# Patient Record
Sex: Female | Born: 1970 | Race: White | Hispanic: No | Marital: Married | Smoking: Never smoker
Health system: Southern US, Community
[De-identification: ages and names within clinical notes are randomized; demographics above are authoritative.]

---

## 1997-09-14 ENCOUNTER — Other Ambulatory Visit: Admission: RE | Admit: 1997-09-14 | Discharge: 1997-09-14 | Payer: Self-pay | Admitting: Obstetrics & Gynecology

## 1998-04-17 ENCOUNTER — Other Ambulatory Visit: Admission: RE | Admit: 1998-04-17 | Discharge: 1998-04-17 | Payer: Self-pay | Admitting: Obstetrics & Gynecology

## 1999-05-30 ENCOUNTER — Other Ambulatory Visit: Admission: RE | Admit: 1999-05-30 | Discharge: 1999-05-30 | Payer: Self-pay | Admitting: Obstetrics & Gynecology

## 2000-04-15 ENCOUNTER — Other Ambulatory Visit: Admission: RE | Admit: 2000-04-15 | Discharge: 2000-04-15 | Payer: Self-pay | Admitting: Obstetrics & Gynecology

## 2000-10-25 ENCOUNTER — Inpatient Hospital Stay (HOSPITAL_COMMUNITY): Admission: AD | Admit: 2000-10-25 | Discharge: 2000-10-25 | Payer: Self-pay | Admitting: Obstetrics & Gynecology

## 2000-10-28 ENCOUNTER — Ambulatory Visit (HOSPITAL_COMMUNITY): Admission: RE | Admit: 2000-10-28 | Discharge: 2000-10-28 | Payer: Self-pay | Admitting: Obstetrics & Gynecology

## 2000-10-29 ENCOUNTER — Inpatient Hospital Stay (HOSPITAL_COMMUNITY): Admission: AD | Admit: 2000-10-29 | Discharge: 2000-11-01 | Payer: Self-pay | Admitting: Obstetrics & Gynecology

## 2000-11-06 ENCOUNTER — Encounter: Admission: RE | Admit: 2000-11-06 | Discharge: 2000-12-06 | Payer: Self-pay | Admitting: Obstetrics & Gynecology

## 2000-12-22 ENCOUNTER — Other Ambulatory Visit: Admission: RE | Admit: 2000-12-22 | Discharge: 2000-12-22 | Payer: Self-pay | Admitting: Obstetrics & Gynecology

## 2001-01-06 ENCOUNTER — Encounter: Admission: RE | Admit: 2001-01-06 | Discharge: 2001-02-05 | Payer: Self-pay | Admitting: Obstetrics & Gynecology

## 2001-11-22 ENCOUNTER — Other Ambulatory Visit: Admission: RE | Admit: 2001-11-22 | Discharge: 2001-11-22 | Payer: Self-pay | Admitting: Obstetrics & Gynecology

## 2001-11-25 ENCOUNTER — Encounter: Payer: Self-pay | Admitting: Obstetrics & Gynecology

## 2001-11-25 ENCOUNTER — Encounter: Admission: RE | Admit: 2001-11-25 | Discharge: 2001-11-25 | Payer: Self-pay | Admitting: Obstetrics & Gynecology

## 2002-05-23 ENCOUNTER — Inpatient Hospital Stay (HOSPITAL_COMMUNITY): Admission: AD | Admit: 2002-05-23 | Discharge: 2002-05-27 | Payer: Self-pay | Admitting: Obstetrics & Gynecology

## 2002-05-23 ENCOUNTER — Encounter (INDEPENDENT_AMBULATORY_CARE_PROVIDER_SITE_OTHER): Payer: Self-pay | Admitting: *Deleted

## 2002-06-28 ENCOUNTER — Other Ambulatory Visit: Admission: RE | Admit: 2002-06-28 | Discharge: 2002-06-28 | Payer: Self-pay | Admitting: Obstetrics & Gynecology

## 2003-07-02 ENCOUNTER — Emergency Department (HOSPITAL_COMMUNITY): Admission: AD | Admit: 2003-07-02 | Discharge: 2003-07-02 | Payer: Self-pay | Admitting: Family Medicine

## 2003-07-18 ENCOUNTER — Other Ambulatory Visit: Admission: RE | Admit: 2003-07-18 | Discharge: 2003-07-18 | Payer: Self-pay | Admitting: Obstetrics & Gynecology

## 2003-10-08 ENCOUNTER — Emergency Department (HOSPITAL_COMMUNITY): Admission: EM | Admit: 2003-10-08 | Discharge: 2003-10-08 | Payer: Self-pay | Admitting: Family Medicine

## 2003-12-27 ENCOUNTER — Emergency Department (HOSPITAL_COMMUNITY): Admission: EM | Admit: 2003-12-27 | Discharge: 2003-12-27 | Payer: Self-pay | Admitting: Emergency Medicine

## 2004-08-19 ENCOUNTER — Other Ambulatory Visit: Admission: RE | Admit: 2004-08-19 | Discharge: 2004-08-19 | Payer: Self-pay | Admitting: Obstetrics and Gynecology

## 2005-09-10 ENCOUNTER — Encounter (INDEPENDENT_AMBULATORY_CARE_PROVIDER_SITE_OTHER): Payer: Self-pay | Admitting: Specialist

## 2005-09-10 ENCOUNTER — Encounter: Admission: RE | Admit: 2005-09-10 | Discharge: 2005-09-10 | Payer: Self-pay | Admitting: Obstetrics & Gynecology

## 2005-10-17 ENCOUNTER — Encounter: Admission: RE | Admit: 2005-10-17 | Discharge: 2005-10-17 | Payer: Self-pay | Admitting: Obstetrics & Gynecology

## 2008-02-03 ENCOUNTER — Encounter: Admission: RE | Admit: 2008-02-03 | Discharge: 2008-02-03 | Payer: Self-pay | Admitting: Obstetrics & Gynecology

## 2008-02-11 ENCOUNTER — Emergency Department (HOSPITAL_COMMUNITY): Admission: EM | Admit: 2008-02-11 | Discharge: 2008-02-11 | Payer: Self-pay | Admitting: Family Medicine

## 2010-06-10 ENCOUNTER — Other Ambulatory Visit: Payer: Self-pay | Admitting: Dermatology

## 2010-10-04 NOTE — Op Note (Signed)
NAME:  [name], [name]                            ACCOUNT NO.:  [id number]   MEDICAL RECORD NO.:  [id number]                   PATIENT TYPE:  INP   LOCATION:  9144                                 FACILITY:  [facility]   PHYSICIAN:  Freddy Finner, M.D.                DATE OF BIRTH:  Jun 19, 1970   DATE OF PROCEDURE:  DATE OF DISCHARGE:                                 OPERATIVE REPORT   PREOPERATIVE DIAGNOSES:  1. Intrauterine pregnancy at 37 weeks' gestation.  2. Surgically scarred uterus.  3. Multiparity.  4. In labor.   POSTOPERATIVE DIAGNOSES:  1. Intrauterine pregnancy at 37 weeks' gestation.  2. Surgically scarred uterus.  3. Multiparity.  4. In labor.   PROCEDURE:  Delivery of viable female infant, Apgars of 9 at one minute, 9 at  five minutes, cord arterial pH of 7.34.   SECONDARY PROCEDURE:  Bilateral tubal ligation by [place] technique.   SURGEON:  Freddy Finner, M.D.   ASSISTANT:  Dineen Kid. Rana Snare, M.D.   ANESTHESIA:  Spinal.   ESTIMATED BLOOD LOSS:  600 cubic centimeters.   COMPLICATIONS:  None.  Cord blood was collected for stem cell storage  intraoperatively with Betadine prep of the cord and sterile collection of  the specimen, and provided anticoagulant and __________.   INDICATIONS FOR PROCEDURE:  The patient was admitted on the evening of  surgery in labor at 37 weeks' gestation.  She had a scheduled C-section in  approximately 10 days, but presented in labor.  This is her third viable  pregnancy and she has requested voluntary sterilization.   DESCRIPTION OF PROCEDURE:  She was brought to the operating room and was  placed under adequate spinal anesthesia, placed in the dorsal recumbent  position with elevation of the right hip by 15 degrees.  The abdomen was  prepped and draped with sterile scrub followed by sterile Betadine solution.  Foley catheter was placed using sterile technique.  Sterile drapes were  applied.  A lower abdominal transverse incision was  made and an old scar  excised sharply.  Incision was carried down the fascia which was trimmed  __________.  The skin was stationed.  Rectus sheath was developed superiorly  and inferiorly with blunt and sharp dissection.  Rectus muscles were divided  in the midline.  The peritoneum was entered sharply and extended bluntly to  the external skin incision.  Bladder blade was placed.  A transverse  incision was made in the initial peritoneum overlying the lower segment and  the bladder bluntly dissected off the lower segment.  The lower segment was  entered sharply in a transverse direction and extended transversely with  blunt dissection.  Using a QE  vacuum extractor over fundal compression, a  viable female infant was then delivered without difficulty.  A single-edged  nuchal cord was reduced.  Apgars and cord pH were noted above.  Arterial  cord sample was obtained for gases.  After Betadine prep of the umbilical  cord the stem cell specimen was collected and passed off the table.  Uterus  was then delivered free of the abdominal incision.  Tubes and ovaries were  normal.  The uterine incision was closed in a single layer of running 0  Monocryl.  Bladder flap was reapproximated with an interrupted 0 Monocryl.  Window was created in the mesosalpinx while elevating each tube with Babcock  and through it, #0 plain ties were placed inside at approximately 2 cm  intervals and a segment of tube excised.  The distal mucosal portion of the  distal tube was fulgurated with a Bovie, 0 silk suture was passed through  the proximal mesosalpinx and tied above the __________.  Hemostasis was  adequate.  The uterus was delivered back into the abdominal cavity.  Irrigation was carried out.  Again hemostasis was complete.  All packs,  needles, and instruments were removed.  Counts were correct.  Abdominal  incision was closed in layers.  Running 0 Monocryl was used to close the  peritoneum and reapproximate  the rectus muscles.  __________ was closed with  running 0 PDS.  Subcutaneous tissue was approximated with running 3-0 plain.  Skin was closed with rod and skin staples and quarter inch Steri-Strips.  The patient tolerated the procedure well and was taken to recovery in good  condition.                                                    Freddy Finner, M.D.    WRN/MEDQ  D:  05/23/2002  T:  05/23/2002  Job:  [id number]

## 2010-10-04 NOTE — Discharge Summary (Signed)
NAMEJezlyn, [name]                            ACCOUNT NO.:  [id number]   MEDICAL RECORD NO.:  [id number]                   PATIENT TYPE:  INP   LOCATION:  9144                                 FACILITY:  [facility]   PHYSICIAN:  Freddy Finner, M.D.                DATE OF BIRTH:  1970-08-06   DATE OF ADMISSION:  05/23/2002  DATE OF DISCHARGE:  05/27/2002                                 DISCHARGE SUMMARY   ADMISSION DIAGNOSES:  1. Intrauterine pregnancy at 37 weeks estimated gestational age.  2. Surgically scarred uterus.  3. Multiparity, desires sterility for labor.   DISCHARGE DIAGNOSES:  1. Status post low transverse cesarean section.  2. Viable female infant.  3. Permanent sterilization.   PROCEDURE:  1. Repeat low transverse cesarean section.  2. Bilateral tubal ligation.   REASON FOR ADMISSION:  Please see written H&P.   HOSPITAL COURSE:  The patient is a 40 year old gravida 3, para 2 who  presented to [facility] in labor at 37 weeks estimated  gestational age.  The patient had a history of a previous cesarean delivery  and was scheduled for a repeat cesarean delivery in approximately 10 days.  The patient had also requested permanent sterilization.  The patient was  prepped accordingly and taken to the operating room where spinal anesthesia  was administered without difficulty.  A low transverse incision was made  with the delivery of a viable female infant weighing 6 pounds 13 ounces with  Apgars of 9 at one minute and 9 at five minutes.  Umbilical cord pH was  7.34.  Cord blood was obtained for stem cell storage.  Bilateral tubal  ligation was performed.  The patient tolerated the procedure well and was  taken to the recovery room in stable condition.   On postoperative day #1, the patient had good return of bowel function.  Abdomen was soft.  Abdominal dressing had some small old drainage noted on  bandage.  The patient was ambulating without assistance.   Labs revealed a  hemoglobin 10.2, platelet count 159,000, WBC count of 8.1.  On postoperative  day #2, the patient was doing well.  Abdominal dressing was removed,  revealing an incision that was clean, dry, and intact.  On postoperative day  #3, the patient complained of increase in abdominal distension.  The abdomen  was soft.  The incision was noted to have slight ecchymosis noted on the  left side of the incision and a small amount of oozing that was noted at the  midpoint.  Labs revealed a hemoglobin of 10.2, WBC count 8.1.  A Dulcolax  suppository was administered.  On postoperative day #4, the patient was  doing well.  Abdomen was soft.  The incision was clean, dry, and intact.  No  further oozing was noted.  Ecchymosis continued on the left margin of the  incision.  The  staples were removed.  The patient was discharged to home.   CONDITION ON DISCHARGE:  Good.   DIET:  Regular as tolerated.   ACTIVITY:  No heavy lifting, no driving x2 weeks, no vaginal entry.   FOLLOW UP:  The patient is to follow up in the office in one to two weeks  for an incision check.  She should call for a temperature greater than 100  degrees, persistent nausea and vomiting, heavy vaginal bleeding, and/or  redness or drainage from the incisional site.   DISCHARGE MEDICATIONS:  1. Percocet 5/325, #30, one p.o. every four to six hours p.r.n. pain.  2. Ambien 10 mg, #21, p.o. q.h.s. p.r.n. insomnia.  3. Motrin 600 mg, #30, one p.o. every six hours.  4. Prenatal vitamin one p.o. daily.     Julio Sicks, N.P.                        Freddy Finner, M.D.    CC/MEDQ  D:  06/30/2002  T:  06/30/2002  Job:  [id number]

## 2010-10-04 NOTE — H&P (Signed)
   NAME:  [name]                            ACCOUNT NO.:  [id number]   MEDICAL RECORD NO.:  [id number]                   PATIENT TYPE:  INP   LOCATION:  9198                                 FACILITY:  [facility]   PHYSICIAN:  Dineen Kid. Rana Snare, M.D.                 DATE OF BIRTH:  [birth year]   DATE OF ADMISSION:  05/23/2002  DATE OF DISCHARGE:                                HISTORY & PHYSICAL   HISTORY OF PRESENT ILLNESS:  The patient is a 40 year old G3, P2 at 55 and 1  estimated gestational age who presents in early labor, contracting every  five minutes.  Her estimated date of confinement is June 12, 2002.  Pregnancy has been uncomplicated.  She does have a history of a previous  cesarean section and a previous child in 1994 that was severely ill with  group [name] Strep.  Because of history of group [name] Strep in the first child, they  had a scheduled cesarean section with their second child in June 2002 and  presents for a repeat cesarean section today.  History of GBS is positive.  The patient also adamantly desires sterilization.   PAST MEDICAL HISTORY:  Significant for OCD.   PAST SURGICAL HISTORY:  Significant for cesarean section in June 2002 and  also rhinoplasty at age 30.   MEDICATIONS:  Paxil and prenatal vitamins.   ALLERGIES:  She is allergic to DEMEROL.   PHYSICAL EXAMINATION:  VITAL SIGNS:  Blood pressure 104/62.  HEART:  Regular rate and rhythm.  LUNGS:  Clear to auscultation bilaterally.  ABDOMEN:  Gravid, nontender.  PELVIC:  Cervix is closed, thick, and high.  Fetal heart rate in the 140s.   IMPRESSION AND PLAN:  1. Intrauterine pregnancy at 28 and 1/7 weeks in early labor.  2. Previous cesarean section, desired repeat.  3. Also, adamantly desires sterilization.  Risks and benefits of the     procedure were discussed at length which include, but not limited to,     risk of infection, bleeding, damage to uterus, tubes, ovaries, bowel,     bladder, and fetus.  Also,  risk of tubal failure quoted as 5 out of 1000.     The patient gives her informed consent.                                               Dineen Kid Rana Snare, M.D.    DCL/MEDQ  D:  05/23/2002  T:  05/23/2002  Job:  [id number]

## 2010-10-04 NOTE — Discharge Summary (Signed)
[facility]  Patient:    [name], [name]                         MRN: [id number] Adm. Date:  [id number] Disc. Date: [id number] Attending:  Minette Headland Dictator:   Danie Chandler, R.N.                           Discharge Summary  ADMITTING DIAGNOSES:          1. Intrauterine pregnancy at term.                               2. Mature LS ratio and positive prostaglandin                                  on amniocentesis.                               3. Previous history of Streptococcus sepsis with                                  the patients first delivery requiring                                  ______ and patient request to avoid risk of                                  Streptococcus sepsis by having cesarean.  DISCHARGE DIAGNOSES:          1. Intrauterine pregnancy at term.                               2. Mature LS ratio and positive prostaglandin                                  on amniocentesis.                               3. Previous history of Streptococcus sepsis with                                  the patients first delivery requiring                                  ______ and patient request to avoid risk of                                  Streptococcus sepsis by having cesarean.  PROCEDURE:                    On October 29, 2000 a primary low transverse  cesarean section.  REASON FOR ADMISSION:         Please see dictated H&P.  HOSPITAL COURSE:              The patient was taken to the operating room and underwent the above named procedure without complication.  This was productive of a viable female infant with Apgars of 8 at one minute and 9 at five minutes.  Postoperatively on day #1 the patients hemoglobin was 10.3, hematocrit 30.6, white blood cell count 7.4.  The patient had a good return of bowel function on this day and was without complaint.  On postoperative day #2 she was tolerating a regular diet and ambulating well  without difficulty.  She also had good pain control.  She was discharged home on postoperative day #3.  CONDITION ON DISCHARGE:       Good.  DIET:                         Regular, as tolerated.  ACTIVITY:                     No heavy lifting, no driving, no vaginal entry.  FOLLOW-UP:                    In the office in one to two weeks for incision check.  She is to call for temperature greater than 100 degrees, persistent nausea or vomiting, heavy vaginal bleeding, and/or redness or drainage from the incision site.  DISCHARGE MEDICATIONS:        1. Prenatal vitamins one p.o. q.d.                               2. Tylox one q.4h. p.r.n. pain. DD:  11/18/00 TD:  11/18/00 Job: [id number]

## 2010-10-04 NOTE — H&P (Signed)
[facility]  PatientARLENY, [name]                           MRN: [id number] Adm. Date:  10/29/00 Attending:  Freddy Finner, M.D.                         History and Physical  ADMISSION DIAGNOSES:          1. Intrauterine pregnancy at 38-1/[redacted] weeks                                  gestation.                               2. Mature pattern for lung maturity on                                  amniocentesis on October 28, 2000.                               3. History of major neonatal complication on                                  first delivery with streptococcal sepsis.                               4. Request on patients behalf to avoid that                                  risk by cesarean delivery on this infant.  HISTORY OF PRESENT ILLNESS:   The patient is a 40 year old black married female, gravida 2, para 1, whose current pregnancy has been uneventful.  She has had some latent phase activity suggesting impending onset of labor.  She is 1 cm dilated, 25% effaced on examination of the cervix in the office on the day prior to admission.  Her first delivery was complicated by strep sepsis in her infant who was transported to [facility] and cared for there with ECMO.  Based on this complication with the first delivery, she is unwilling to take that risk with this pregnancy and is now admitted with membranes intact for cesarean delivery.  An amniocentesis performed in the office on June 12 revealed L/S ration of of 3.5:1 with PG present.  REVIEW OF SYSTEMS:            Currently negative except for for sporadic contractions, one episode requiring evaluation in the hospital approximately two to three days prior to this admission.  PAST MEDICAL HISTORY:         Recorded in detail in the prenatal summary and will not be repeated.  FAMILY HISTORY:               Noncontributory.  PHYSICAL EXAMINATION:  HEENT:                        Grossly  normal.  VITAL  SIGNS:                  Blood pressure in the office was 110/70.  Weight 158.  CHEST:                        Clear to auscultation.  HEART:                        Normal sinus rhythm without murmurs, rubs, or gallops.  ABDOMEN:                      Gravid. Estimated fetal weight 7-1/2 pounds.  PELVIC:                       As recorded, cervix is 25% effaced, 1 cm dilated.  EXTREMITIES:                  Show +1 edema with no cyanosis or clubbing.  ASSESSMENT:                   1. Intrauterine pregnancy at 38-1/[redacted] weeks                                  gestation.                               2. Sever complication with first delivery due                                  to streptococcal sepsis.                               3. Patient request for cesarean delivery with                                  this pregnancy.                               4. Mature licithin/sphingomyelin (L/S) ratio.                               5. Positive prostaglandin on amniocentesis.  PLAN:                         Cesarean delivery on October 29, 2000. DD:  10/28/00 TD:  10/28/00 Job: [id number]

## 2014-04-27 ENCOUNTER — Other Ambulatory Visit: Payer: Self-pay | Admitting: Obstetrics & Gynecology

## 2014-04-28 LAB — CYTOLOGY - PAP

## 2016-06-12 DIAGNOSIS — S022XXA Fracture of nasal bones, initial encounter for closed fracture: Secondary | ICD-10-CM | POA: Diagnosis not present

## 2016-06-12 DIAGNOSIS — S62356A Nondisplaced fracture of shaft of fifth metacarpal bone, right hand, initial encounter for closed fracture: Secondary | ICD-10-CM | POA: Diagnosis not present

## 2016-06-13 DIAGNOSIS — S62356A Nondisplaced fracture of shaft of fifth metacarpal bone, right hand, initial encounter for closed fracture: Secondary | ICD-10-CM | POA: Diagnosis not present

## 2016-07-09 DIAGNOSIS — Z1389 Encounter for screening for other disorder: Secondary | ICD-10-CM | POA: Diagnosis not present

## 2016-09-15 DIAGNOSIS — Z1231 Encounter for screening mammogram for malignant neoplasm of breast: Secondary | ICD-10-CM | POA: Diagnosis not present

## 2016-09-15 DIAGNOSIS — Z01419 Encounter for gynecological examination (general) (routine) without abnormal findings: Secondary | ICD-10-CM | POA: Diagnosis not present

## 2016-09-26 DIAGNOSIS — D2362 Other benign neoplasm of skin of left upper limb, including shoulder: Secondary | ICD-10-CM | POA: Diagnosis not present

## 2016-09-26 DIAGNOSIS — D2262 Melanocytic nevi of left upper limb, including shoulder: Secondary | ICD-10-CM | POA: Diagnosis not present

## 2016-09-26 DIAGNOSIS — D2261 Melanocytic nevi of right upper limb, including shoulder: Secondary | ICD-10-CM | POA: Diagnosis not present

## 2017-02-17 DIAGNOSIS — Z0000 Encounter for general adult medical examination without abnormal findings: Secondary | ICD-10-CM | POA: Diagnosis not present

## 2017-02-23 DIAGNOSIS — Z1389 Encounter for screening for other disorder: Secondary | ICD-10-CM | POA: Diagnosis not present

## 2017-02-23 DIAGNOSIS — Z0000 Encounter for general adult medical examination without abnormal findings: Secondary | ICD-10-CM | POA: Diagnosis not present

## 2017-02-23 DIAGNOSIS — Z23 Encounter for immunization: Secondary | ICD-10-CM | POA: Diagnosis not present

## 2017-02-23 DIAGNOSIS — D72819 Decreased white blood cell count, unspecified: Secondary | ICD-10-CM | POA: Diagnosis not present

## 2017-08-08 DIAGNOSIS — R51 Headache: Secondary | ICD-10-CM | POA: Diagnosis not present

## 2017-08-08 DIAGNOSIS — R05 Cough: Secondary | ICD-10-CM | POA: Diagnosis not present

## 2017-08-24 DIAGNOSIS — M76899 Other specified enthesopathies of unspecified lower limb, excluding foot: Secondary | ICD-10-CM | POA: Diagnosis not present

## 2017-09-02 DIAGNOSIS — S76311D Strain of muscle, fascia and tendon of the posterior muscle group at thigh level, right thigh, subsequent encounter: Secondary | ICD-10-CM | POA: Diagnosis not present

## 2017-09-08 DIAGNOSIS — S76311D Strain of muscle, fascia and tendon of the posterior muscle group at thigh level, right thigh, subsequent encounter: Secondary | ICD-10-CM | POA: Diagnosis not present

## 2017-09-10 DIAGNOSIS — S76311D Strain of muscle, fascia and tendon of the posterior muscle group at thigh level, right thigh, subsequent encounter: Secondary | ICD-10-CM | POA: Diagnosis not present

## 2017-09-14 DIAGNOSIS — S76311D Strain of muscle, fascia and tendon of the posterior muscle group at thigh level, right thigh, subsequent encounter: Secondary | ICD-10-CM | POA: Diagnosis not present

## 2017-09-16 DIAGNOSIS — S76311D Strain of muscle, fascia and tendon of the posterior muscle group at thigh level, right thigh, subsequent encounter: Secondary | ICD-10-CM | POA: Diagnosis not present

## 2017-09-22 DIAGNOSIS — S76311D Strain of muscle, fascia and tendon of the posterior muscle group at thigh level, right thigh, subsequent encounter: Secondary | ICD-10-CM | POA: Diagnosis not present

## 2017-09-24 DIAGNOSIS — S76311D Strain of muscle, fascia and tendon of the posterior muscle group at thigh level, right thigh, subsequent encounter: Secondary | ICD-10-CM | POA: Diagnosis not present

## 2017-09-29 DIAGNOSIS — S76311D Strain of muscle, fascia and tendon of the posterior muscle group at thigh level, right thigh, subsequent encounter: Secondary | ICD-10-CM | POA: Diagnosis not present

## 2017-10-01 DIAGNOSIS — S76311D Strain of muscle, fascia and tendon of the posterior muscle group at thigh level, right thigh, subsequent encounter: Secondary | ICD-10-CM | POA: Diagnosis not present

## 2017-10-06 DIAGNOSIS — S76311D Strain of muscle, fascia and tendon of the posterior muscle group at thigh level, right thigh, subsequent encounter: Secondary | ICD-10-CM | POA: Diagnosis not present

## 2017-10-13 DIAGNOSIS — S76311D Strain of muscle, fascia and tendon of the posterior muscle group at thigh level, right thigh, subsequent encounter: Secondary | ICD-10-CM | POA: Diagnosis not present

## 2017-10-16 DIAGNOSIS — S76311D Strain of muscle, fascia and tendon of the posterior muscle group at thigh level, right thigh, subsequent encounter: Secondary | ICD-10-CM | POA: Diagnosis not present

## 2017-10-20 DIAGNOSIS — S76311D Strain of muscle, fascia and tendon of the posterior muscle group at thigh level, right thigh, subsequent encounter: Secondary | ICD-10-CM | POA: Diagnosis not present

## 2017-10-21 DIAGNOSIS — D229 Melanocytic nevi, unspecified: Secondary | ICD-10-CM | POA: Diagnosis not present

## 2017-10-21 DIAGNOSIS — L570 Actinic keratosis: Secondary | ICD-10-CM | POA: Diagnosis not present

## 2017-10-28 DIAGNOSIS — M5416 Radiculopathy, lumbar region: Secondary | ICD-10-CM | POA: Diagnosis not present

## 2017-10-29 DIAGNOSIS — M5416 Radiculopathy, lumbar region: Secondary | ICD-10-CM | POA: Diagnosis not present

## 2017-11-05 DIAGNOSIS — Z01419 Encounter for gynecological examination (general) (routine) without abnormal findings: Secondary | ICD-10-CM | POA: Diagnosis not present

## 2017-11-05 DIAGNOSIS — Z681 Body mass index (BMI) 19 or less, adult: Secondary | ICD-10-CM | POA: Diagnosis not present

## 2017-12-03 DIAGNOSIS — M5416 Radiculopathy, lumbar region: Secondary | ICD-10-CM | POA: Diagnosis not present

## 2018-01-04 DIAGNOSIS — M5416 Radiculopathy, lumbar region: Secondary | ICD-10-CM | POA: Diagnosis not present

## 2018-01-04 DIAGNOSIS — M5431 Sciatica, right side: Secondary | ICD-10-CM | POA: Diagnosis not present

## 2018-01-28 DIAGNOSIS — M5431 Sciatica, right side: Secondary | ICD-10-CM | POA: Diagnosis not present

## 2018-02-01 DIAGNOSIS — Z23 Encounter for immunization: Secondary | ICD-10-CM | POA: Diagnosis not present

## 2018-03-16 DIAGNOSIS — Z0000 Encounter for general adult medical examination without abnormal findings: Secondary | ICD-10-CM | POA: Diagnosis not present

## 2018-03-16 DIAGNOSIS — R82998 Other abnormal findings in urine: Secondary | ICD-10-CM | POA: Diagnosis not present

## 2018-03-23 DIAGNOSIS — Z1389 Encounter for screening for other disorder: Secondary | ICD-10-CM | POA: Diagnosis not present

## 2018-03-23 DIAGNOSIS — Z23 Encounter for immunization: Secondary | ICD-10-CM | POA: Diagnosis not present

## 2018-03-23 DIAGNOSIS — Z0000 Encounter for general adult medical examination without abnormal findings: Secondary | ICD-10-CM | POA: Diagnosis not present

## 2018-03-23 DIAGNOSIS — D72819 Decreased white blood cell count, unspecified: Secondary | ICD-10-CM | POA: Diagnosis not present

## 2019-02-23 ENCOUNTER — Encounter: Payer: Self-pay | Admitting: *Deleted

## 2021-05-19 HISTORY — PX: ROBOTIC ASSISTED LAP VAGINAL HYSTERECTOMY: SHX2362

## 2021-07-17 ENCOUNTER — Other Ambulatory Visit: Payer: Self-pay | Admitting: Obstetrics and Gynecology

## 2022-10-07 ENCOUNTER — Encounter: Payer: Self-pay | Admitting: Internal Medicine

## 2022-11-13 ENCOUNTER — Ambulatory Visit: Payer: 59

## 2022-11-13 VITALS — Ht 67.0 in | Wt 120.0 lb

## 2022-11-13 DIAGNOSIS — Z1211 Encounter for screening for malignant neoplasm of colon: Secondary | ICD-10-CM

## 2022-11-13 MED ORDER — NA SULFATE-K SULFATE-MG SULF 17.5-3.13-1.6 GM/177ML PO SOLN: 1.0000 | Freq: Once | ORAL | 0 refills | Status: AC

## 2022-11-13 NOTE — Progress Notes (Signed)

## 2022-11-16 ENCOUNTER — Encounter: Payer: Self-pay | Admitting: Internal Medicine

## 2022-12-09 ENCOUNTER — Ambulatory Visit (AMBULATORY_SURGERY_CENTER): Payer: 59 | Admitting: Internal Medicine

## 2022-12-09 ENCOUNTER — Encounter: Payer: Self-pay | Admitting: Internal Medicine

## 2022-12-09 VITALS — BP 98/74 | HR 59 | Temp 98.2°F | Resp 14 | Ht 67.0 in | Wt 120.0 lb

## 2022-12-09 DIAGNOSIS — D120 Benign neoplasm of cecum: Secondary | ICD-10-CM | POA: Diagnosis not present

## 2022-12-09 DIAGNOSIS — Z1211 Encounter for screening for malignant neoplasm of colon: Secondary | ICD-10-CM | POA: Diagnosis present

## 2022-12-09 DIAGNOSIS — K635 Polyp of colon: Secondary | ICD-10-CM | POA: Diagnosis not present

## 2022-12-09 MED ORDER — SODIUM CHLORIDE 0.9 % IV SOLN: 500.0000 mL | Freq: Once | INTRAVENOUS | Status: DC

## 2022-12-09 NOTE — Progress Notes (Signed)
Pt's states no medical or surgical changes since previsit or office visit. 

## 2022-12-09 NOTE — Patient Instructions (Signed)
   Handouts on polyps given to you today.   Await pathology results on polyp removed     YOU HAD AN ENDOSCOPIC PROCEDURE TODAY AT THE [facility]:   Refer to the procedure report that was given to you for any specific questions about what was found during the examination.  If the procedure report does not answer your questions, please call your gastroenterologist to clarify.  If you requested that your care partner not be given the details of your procedure findings, then the procedure report has been included in a sealed envelope for you to review at your convenience later.  YOU SHOULD EXPECT: Some feelings of bloating in the abdomen. Passage of more gas than usual.  Walking can help get rid of the air that was put into your GI tract during the procedure and reduce the bloating. If you had a lower endoscopy (such as a colonoscopy or flexible sigmoidoscopy) you may notice spotting of blood in your stool or on the toilet paper. If you underwent a bowel prep for your procedure, you may not have a normal bowel movement for a few days.  Please Note:  You might notice some irritation and congestion in your nose or some drainage.  This is from the oxygen used during your procedure.  There is no need for concern and it should clear up in a day or so.  SYMPTOMS TO REPORT IMMEDIATELY:  Following lower endoscopy (colonoscopy or flexible sigmoidoscopy):  Excessive amounts of blood in the stool  Significant tenderness or worsening of abdominal pains  Swelling of the abdomen that is new, acute  Fever of 100F or higher  For urgent or emergent issues, a gastroenterologist can be reached at any hour by calling [phone]. Do not use MyChart messaging for urgent concerns.    DIET:  We do recommend a small meal at first, but then you may proceed to your regular diet.  Drink plenty of fluids but you should avoid alcoholic beverages for 24 hours.  ACTIVITY:  You should plan to take it easy  for the rest of today and you should NOT DRIVE or use heavy machinery until tomorrow (because of the sedation medicines used during the test).    FOLLOW UP: Our staff will call the number listed on your records the next business day following your procedure.  We will call around 7:15- 8:00 am to check on you and address any questions or concerns that you may have regarding the information given to you following your procedure. If we do not reach you, we will leave a message.     If any biopsies were taken you will be contacted by phone or by letter within the next 1-3 weeks.  Please call us at [phone] if you have not heard about the biopsies in 3 weeks.    SIGNATURES/CONFIDENTIALITY: You and/or your care partner have signed paperwork which will be entered into your electronic medical record.  These signatures attest to the fact that that the information above on your After Visit Summary has been reviewed and is understood.  Full responsibility of the confidentiality of this discharge information lies with you and/or your care-partner.

## 2022-12-09 NOTE — Op Note (Signed)
[facility] Patient Name: [name] Procedure Date: 12/09/2022 1:13 PM MRN: [id number] Endoscopist: Beverley Fiedler , MD, [id number] Age: 52 Referring MD:  Date of Birth: 1971-05-16 Gender: Female Account #: [id number] Procedure:                Colonoscopy Indications:              Screening for colorectal malignant neoplasm, This                            is the patient's first colonoscopy Medicines:                Monitored Anesthesia Care Procedure:                Pre-Anesthesia Assessment:                           - Prior to the procedure, a History and Physical                            was performed, and patient medications and                            allergies were reviewed. The patient's tolerance of                            previous anesthesia was also reviewed. The risks                            and benefits of the procedure and the sedation                            options and risks were discussed with the patient.                            All questions were answered, and informed consent                            was obtained. Prior Anticoagulants: The patient has                            taken no anticoagulant or antiplatelet agents. ASA                            Grade Assessment: I - A normal, healthy patient.                            After reviewing the risks and benefits, the patient                            was deemed in satisfactory condition to undergo the                            procedure.  After obtaining informed consent, the colonoscope                            was passed under direct vision. Throughout the                            procedure, the patient's blood pressure, pulse, and                            oxygen saturations were monitored continuously. The                            Olympus PCF-H190DL (#[id number]) Colonoscope was                            introduced through the anus and advanced to  the                            cecum, identified by appendiceal orifice and                            ileocecal valve. The colonoscopy was performed                            without difficulty. The patient tolerated the                            procedure well. The quality of the bowel                            preparation was excellent. The ileocecal valve,                            appendiceal orifice, and rectum were photographed. Scope In: 1:42:56 PM Scope Out: 2:01:44 PM Scope Withdrawal Time: 0 hours 11 minutes 47 seconds  Total Procedure Duration: 0 hours 18 minutes 48 seconds  Findings:                 The digital rectal exam was normal.                           A 4 mm polyp was found in the cecum. The polyp was                            sessile. The polyp was removed with a cold snare.                            Resection and retrieval were complete.                           The exam was otherwise without abnormality on                            direct and retroflexion views. Complications:  No immediate complications. Estimated Blood Loss:     Estimated blood loss: none. Impression:               - One 4 mm polyp in the cecum, removed with a cold                            snare. Resected and retrieved.                           - The examination was otherwise normal on direct                            and retroflexion views. Recommendation:           - Patient has a contact number available for                            emergencies. The signs and symptoms of potential                            delayed complications were discussed with the                            patient. Return to normal activities tomorrow.                            Written discharge instructions were provided to the                            patient.                           - Resume previous diet.                           - Continue present medications.                            - Await pathology results.                           - Repeat colonoscopy is recommended. The                            colonoscopy date will be determined after pathology                            results from today's exam become available for                            review. Beverley Fiedler, MD 12/09/2022 2:07:16 PM This report has been signed electronically.

## 2022-12-09 NOTE — Progress Notes (Signed)
Sedate, gd SR, tolerated procedure well, VSS, report to RN 

## 2022-12-09 NOTE — Progress Notes (Signed)
GASTROENTEROLOGY PROCEDURE H&P NOTE   Primary Care Physician: Candice Camp, MD    Reason for Procedure:  Colon cancer screening  Plan:    Colonoscopy  Patient is appropriate for endoscopic procedure(s) in the ambulatory ([facility]) setting.  The nature of the procedure, as well as the risks, benefits, and alternatives were carefully and thoroughly reviewed with the patient. Ample time for discussion and questions allowed. The patient understood, was satisfied, and agreed to proceed.     HPI: [name] is a 52 y.o. female who presents for screening colonoscopy.  Medical history as below.  Tolerated the prep.  No recent chest pain or shortness of breath.  No abdominal pain today.  History reviewed. No pertinent past medical history.  Past Surgical History:  Procedure Laterality Date   CESAREAN SECTION  2002   also in 2004   ROBOTIC ASSISTED LAP VAGINAL HYSTERECTOMY  2023    Prior to Admission medications   Medication Sig Start Date End Date Taking? Authorizing Provider  estradiol (ESTRACE) 2 MG tablet Take 2 mg by mouth daily. 10/20/22  Yes [provider]  lisdexamfetamine (VYVANSE) 50 MG capsule Take 1 capsule by mouth daily. 07/31/17  Yes [provider]  PARoxetine (PAXIL-CR) 25 MG 24 hr tablet TAKE ONE TABLE BY MOUTH ONCE A DAY Patient not taking: Reported on 12/09/2022 06/20/17   [provider]    Current Outpatient Medications  Medication Sig Dispense Refill   estradiol (ESTRACE) 2 MG tablet Take 2 mg by mouth daily.     lisdexamfetamine (VYVANSE) 50 MG capsule Take 1 capsule by mouth daily.     PARoxetine (PAXIL-CR) 25 MG 24 hr tablet TAKE ONE TABLE BY MOUTH ONCE A DAY (Patient not taking: Reported on 12/09/2022)     Current Facility-Administered Medications  Medication Dose Route Frequency Provider Last Rate Last Admin   0.9 %  sodium chloride infusion  500 mL Intravenous Once Terralyn Matsumura, Carie Caddy, MD        Allergies as of 12/09/2022 - Review  Complete 12/09/2022  Allergen Reaction Noted   Meperidine Other (See Comments) 11/06/2022    Family History  Problem Relation Age of Onset   Colon polyps Neg Hx    Colon cancer Neg Hx    Esophageal cancer Neg Hx    Rectal cancer Neg Hx    Stomach cancer Neg Hx     Social History   Socioeconomic History   Marital status: Married    Spouse name: Not on file   Number of children: Not on file   Years of education: Not on file   Highest education level: Not on file  Occupational History   Not on file  Tobacco Use   Smoking status: Never   Smokeless tobacco: Never  Vaping Use   Vaping status: Never Used  Substance and Sexual Activity   Alcohol use: Yes    Comment: occasional   Drug use: Never   Sexual activity: Yes    Birth control/protection: Post-menopausal, Surgical  Other Topics Concern   Not on file  Social History Narrative   ** Merged History Encounter **       Social Determinants of Health   Financial Resource Strain: Not on file  Food Insecurity: Not on file  Transportation Needs: Not on file  Physical Activity: Not on file  Stress: Not on file  Social Connections: Unknown (05/04/2022)   Received from [facility], [facility]   Social Network    Social Network:  Not on file  Intimate Partner Violence: Unknown (05/04/2022)   Received from [facility], [facility]   HITS    Physically Hurt: Not on file    Insult or Talk Down To: Not on file    Threaten Physical Harm: Not on file    Scream or Curse: Not on file    Physical Exam: Vital signs in last 24 hours: @BP  118/73   Pulse 64   Temp 98.2 F (36.8 C) (Temporal)   Resp (!) 9   Ht 5\' 7"  (1.702 m)   Wt 120 lb (54.4 kg)   SpO2 100%   BMI 18.79 kg/m  GEN: NAD EYE: Sclerae anicteric ENT: MMM CV: Non-tachycardic Pulm: CTA b/l GI: Soft, NT/ND NEURO:  Alert & Oriented x 3   Erick Blinks, MD [facility]  12/09/2022 1:41 PM

## 2022-12-09 NOTE — Progress Notes (Signed)
Called to room to assist during endoscopic procedure.  Patient ID and intended procedure confirmed with present staff. Received instructions for my participation in the procedure from the performing physician.  

## 2022-12-10 ENCOUNTER — Telehealth: Payer: Self-pay

## 2022-12-10 NOTE — Telephone Encounter (Signed)
  Follow up Call-     12/09/2022   12:46 PM  Call back number  Post procedure Call Back phone  # [phone]  Permission to leave phone message Yes     Patient questions:  Do you have a fever, pain , or abdominal swelling? No. Pain Score  0 *  Have you tolerated food without any problems? Yes  Have you been able to return to your normal activities? Yes  Do you have any questions about your discharge instructions: Diet   No. Medications  No. Follow up visit  No.  Do you have questions or concerns about your Care? No.  Actions: * If pain score is 4 or above: No action needed, pain <4.

## 2022-12-12 ENCOUNTER — Encounter: Payer: Self-pay | Admitting: Internal Medicine
